# Patient Record
Sex: Male | Born: 1955 | Race: White | Hispanic: No
Health system: Southern US, Community
[De-identification: ages and names within clinical notes are randomized; demographics above are authoritative.]

---

## 2013-11-26 ENCOUNTER — Ambulatory Visit: Payer: Self-pay | Admitting: Ophthalmology

## 2014-04-16 ENCOUNTER — Ambulatory Visit: Payer: Self-pay | Admitting: Ophthalmology

## 2014-04-29 ENCOUNTER — Ambulatory Visit: Payer: Self-pay | Admitting: Ophthalmology

## 2014-06-20 ENCOUNTER — Emergency Department: Payer: Self-pay | Admitting: Emergency Medicine

## 2014-06-20 LAB — CBC WITH DIFFERENTIAL/PLATELET
BASOS ABS: 0 10*3/uL (ref 0.0–0.1)
Basophil #: 0 10*3/uL (ref 0.0–0.1)
Basophil %: 0.6 %
Basophil %: 0.4 %
EOS ABS: 0.1 10*3/uL (ref 0.0–0.7)
EOS ABS: 0 10*3/uL (ref 0.0–0.7)
EOS PCT: 1.2 %
Eosinophil %: 0.5 %
HCT: 42.4 % (ref 40.0–52.0)
HCT: 44.1 % (ref 40.0–52.0)
HGB: 13.7 g/dL (ref 13.0–18.0)
HGB: 13.8 g/dL (ref 13.0–18.0)
LYMPHS ABS: 1.2 10*3/uL (ref 1.0–3.6)
LYMPHS PCT: 17.8 %
LYMPHS PCT: 6.3 %
Lymphocyte #: 0.6 10*3/uL — ABNORMAL LOW (ref 1.0–3.6)
MCH: 26 pg (ref 26.0–34.0)
MCH: 25.4 pg — AB (ref 26.0–34.0)
MCHC: 32.2 g/dL (ref 32.0–36.0)
MCHC: 31.2 g/dL — ABNORMAL LOW (ref 32.0–36.0)
MCV: 81 fL (ref 80–100)
MCV: 81 fL (ref 80–100)
MONO ABS: 0.2 x10 3/mm (ref 0.2–1.0)
MONOS PCT: 1.6 %
Monocyte #: 0.5 x10 3/mm (ref 0.2–1.0)
Monocyte %: 7 %
NEUTROS PCT: 91.2 %
Neutrophil #: 4.8 10*3/uL (ref 1.4–6.5)
Neutrophil #: 8.9 10*3/uL — ABNORMAL HIGH (ref 1.4–6.5)
Neutrophil %: 73.4 %
PLATELETS: 233 10*3/uL (ref 150–440)
PLATELETS: 244 10*3/uL (ref 150–440)
RBC: 5.26 10*6/uL (ref 4.40–5.90)
RBC: 5.41 10*6/uL (ref 4.40–5.90)
RDW: 14.9 % — ABNORMAL HIGH (ref 11.5–14.5)
RDW: 14.8 % — AB (ref 11.5–14.5)
WBC: 6.5 10*3/uL (ref 3.8–10.6)
WBC: 9.8 10*3/uL (ref 3.8–10.6)

## 2014-06-20 LAB — URINALYSIS, COMPLETE
BILIRUBIN, UR: NEGATIVE
Bacteria: NONE SEEN
Blood: NEGATIVE
Glucose,UR: NEGATIVE mg/dL (ref 0–75)
KETONE: NEGATIVE
Leukocyte Esterase: NEGATIVE
Nitrite: NEGATIVE
Ph: 9 (ref 4.5–8.0)
Protein: NEGATIVE
RBC,UR: 5 /HPF (ref 0–5)
SQUAMOUS EPITHELIAL: NONE SEEN
Specific Gravity: 1.014 (ref 1.003–1.030)
WBC UR: <1 /HPF (ref 0–5)

## 2014-06-20 LAB — COMPREHENSIVE METABOLIC PANEL
ALBUMIN: 3.8 g/dL (ref 3.4–5.0)
ALK PHOS: 77 U/L
AST: 25 U/L (ref 15–37)
Anion Gap: 5 — ABNORMAL LOW (ref 7–16)
BUN: 12 mg/dL (ref 7–18)
Bilirubin,Total: 0.5 mg/dL (ref 0.2–1.0)
CHLORIDE: 103 mmol/L (ref 98–107)
CREATININE: 1.01 mg/dL (ref 0.60–1.30)
Calcium, Total: 8.8 mg/dL (ref 8.5–10.1)
Co2: 30 mmol/L (ref 21–32)
EGFR (African American): >60
Glucose: 86 mg/dL (ref 65–99)
Osmolality: 275 (ref 275–301)
POTASSIUM: 4 mmol/L (ref 3.5–5.1)
SGPT (ALT): 19 U/L (ref 12–78)
SODIUM: 138 mmol/L (ref 136–145)
Total Protein: 8.3 g/dL — ABNORMAL HIGH (ref 6.4–8.2)

## 2014-06-20 LAB — TROPONIN I

## 2014-06-20 LAB — BASIC METABOLIC PANEL
ANION GAP: 5 — AB (ref 7–16)
BUN: 12 mg/dL (ref 7–18)
CO2: 26 mmol/L (ref 21–32)
CREATININE: 1.08 mg/dL (ref 0.60–1.30)
Calcium, Total: 8.9 mg/dL (ref 8.5–10.1)
Chloride: 105 mmol/L (ref 98–107)
EGFR (Non-African Amer.): >60
Glucose: 142 mg/dL — ABNORMAL HIGH (ref 65–99)
Osmolality: 274 (ref 275–301)
Potassium: 3.7 mmol/L (ref 3.5–5.1)
Sodium: 136 mmol/L (ref 136–145)

## 2014-06-20 LAB — TSH: Thyroid Stimulating Horm: 0.95 u[IU]/mL

## 2014-06-20 LAB — PRO B NATRIURETIC PEPTIDE: B-TYPE NATIURETIC PEPTID: 209 pg/mL — AB (ref 0–125)

## 2014-06-20 LAB — MAGNESIUM: Magnesium: 2 mg/dL

## 2014-06-20 LAB — D-DIMER(ARMC): D-DIMER: 497 ng/mL

## 2014-06-21 LAB — TROPONIN I: Troponin-I: <0.02 ng/mL

## 2014-06-22 ENCOUNTER — Inpatient Hospital Stay: Payer: Self-pay | Admitting: Family Medicine

## 2014-06-22 LAB — CBC WITH DIFFERENTIAL/PLATELET
Basophil #: 0.1 10*3/uL (ref 0.0–0.1)
Basophil %: 1.2 %
EOS PCT: 1.4 %
Eosinophil #: 0.1 10*3/uL (ref 0.0–0.7)
HCT: 41.3 % (ref 40.0–52.0)
HGB: 13.2 g/dL (ref 13.0–18.0)
LYMPHS ABS: 2.2 10*3/uL (ref 1.0–3.6)
Lymphocyte %: 28 %
MCH: 25.8 pg — AB (ref 26.0–34.0)
MCHC: 31.9 g/dL — ABNORMAL LOW (ref 32.0–36.0)
MCV: 81 fL (ref 80–100)
MONOS PCT: 8.4 %
Monocyte #: 0.7 x10 3/mm (ref 0.2–1.0)
Neutrophil #: 4.8 10*3/uL (ref 1.4–6.5)
Neutrophil %: 61 %
Platelet: 210 10*3/uL (ref 150–440)
RBC: 5.11 10*6/uL (ref 4.40–5.90)
RDW: 15.1 % — AB (ref 11.5–14.5)
WBC: 7.8 10*3/uL (ref 3.8–10.6)

## 2014-06-22 LAB — BASIC METABOLIC PANEL
Anion Gap: 5 — ABNORMAL LOW (ref 7–16)
BUN: 11 mg/dL (ref 7–18)
CHLORIDE: 106 mmol/L (ref 98–107)
CREATININE: 0.99 mg/dL (ref 0.60–1.30)
Calcium, Total: 8.7 mg/dL (ref 8.5–10.1)
Co2: 30 mmol/L (ref 21–32)
EGFR (Non-African Amer.): >60
GLUCOSE: 86 mg/dL (ref 65–99)
Osmolality: 280 (ref 275–301)
Potassium: 4.2 mmol/L (ref 3.5–5.1)
Sodium: 141 mmol/L (ref 136–145)

## 2014-06-22 LAB — MAGNESIUM: MAGNESIUM: 2 mg/dL

## 2014-06-24 LAB — BASIC METABOLIC PANEL
ANION GAP: 6 — AB (ref 7–16)
BUN: 13 mg/dL (ref 7–18)
CHLORIDE: 103 mmol/L (ref 98–107)
Calcium, Total: 9.1 mg/dL (ref 8.5–10.1)
Co2: 29 mmol/L (ref 21–32)
Creatinine: 1.05 mg/dL (ref 0.60–1.30)
EGFR (Non-African Amer.): >60
GLUCOSE: 83 mg/dL (ref 65–99)
Osmolality: 275 (ref 275–301)
Potassium: 3.4 mmol/L — ABNORMAL LOW (ref 3.5–5.1)
Sodium: 138 mmol/L (ref 136–145)

## 2015-03-27 NOTE — Op Note (Signed)
PATIENT NAME:  Samuel Johnston, Mayo MR#:  [id number] DATE OF BIRTH:  1956/01/18  DATE OF PROCEDURE:  11/26/2013  PROCEDURE PERFORMED: 1.  Pars plana vitrectomy of the right eye.  2.  Gas exchange of the right eye.  3.  Endolaser of the right eye.   PREOPERATIVE DIAGNOSIS: Rhegmatogenous retinal detachment, macula-off.   POSTOPERATIVE DIAGNOSIS: Rhegmatogenous retinal detachment, macula-off.    ESTIMATED BLOOD LOSS: Less than 1 mL.   PRIMARY SURGEON: Aron BabaMatthew Tahjir Silveria, M.D.   ANESTHESIA: Retrobulbar block of the right eye with monitored anesthesia care.   COMPLICATIONS: None.   INDICATIONS FOR PROCEDURE: The patient presented to my office with loss of vision in his right eye. Examination revealed a barely macula-off rhegmatogenous retinal detachment of the right eye in his only eye. The risks, benefits and alternatives of the above procedure were discussed, and the patient wished to proceed.   DETAILS OF PROCEDURE: After informed consent was obtained, the patient was brought into the operative suite at [facility]. The patient was placed in the supine position and was given a small dose of propofol and a retrobulbar block was performed on the right eye by the primary surgeon without any complications. The right eye was prepped and draped in a sterile manner. After lid speculum was inserted, a 25-gauge trocar was placed inferotemporally through displaced conjunctiva in an oblique fashion, 4 mm beyond the limbus in the inferotemporal quadrant.  The infusion cannula was turned on and inserted through the trocar and secured in position with Steri-Strips. Two more trocars were placed in a similar fashion superotemporally and superonasally. The vitreous cutter and light pipe were introduced into the eye and a core vitrectomy was performed. The vitreous face was noted already to be elevated. Peripheral vitreous was trimmed for 360 degrees on shave mode with extreme care taken over the  area of retinal detachment. An extremely small retinal tear was identified at approximately 8:30.  An atrophic hole was also noted at approximately 9:30. A small draining posterior retinotomy was created and 9 o'clock. An air-fluid exchange was performed through this hole and the retina completely flattened. Endolaser was introduced and 6 rows of laser were placed in the area of the retinal detachment going from the ora serrata posteriorly. Each of the areas of retinal tears were clearly demarcated and lasered with at least 5 rows. Six to 7 rows of laser was then carried for the temporal 180 degrees. Nasal 180 degrees was given approximately 2 to 3 rows of laser posterior to the vitreous base insertion. Remnant fluid was removed through the draining retinotomy and then 4 rows of laser were placed around the draining retinotomy. 24% SF6 was used as an air gas exchange and the trocars were removed. The wounds were closed using 6-0 plain gut, and the eye was pressurized with SF6 to a pressure of 15 mmHg. 5 mg of dexamethasone was given into the inferior fornix, and the lid speculum was removed. The eye was cleaned, and TobraDex was placed in the eye. A patch and shield were placed over the eye, and the patient was taken to postanesthesia care with instructions to remain on his right side for 1 hour, followed by his left side for 1 week.      ____________________________ Samuel FellingMatthew F. Joyleen Haselton, MD mfa:dmm D: 11/26/2013 19:23:00 ET T: 11/26/2013 19:45:11 ET JOB#: [id number]  cc: Samuel FellingMatthew F. Champ MungoAppenzeller, MD, <Dictator> Cline CoolsMATTHEW F Jazyiah Yiu MD ELECTRONICALLY SIGNED 12/04/2013 7:17

## 2015-03-28 NOTE — H&P (Signed)
PATIENT NAME:  Samuel Johnston, Samuel Johnston MR#:  [id number] DATE OF BIRTH:  [birth year]  DATE OF ADMISSION:  06/20/2014  REFERRING PHYSICIAN: Dr. Mayford Knife  PRIMARY CARE PHYSICIAN: Dr. Burnadette Pop of [facility]   CHIEF COMPLAINT: Passing out.   HISTORY OF PRESENT ILLNESS: A 59 year old gentleman with a history of well-controlled asthma who is presenting after a syncopal episode. He saw his PCP today for 1-2 day duration of sore throat as well as a feeling of shortness of breath. Denies any cough, fevers, chills, or other symptomatology other than occasional lightheadedness. At his PCP's office noted to be hypertensive, systolic blood pressures in the 170s, also noted to have an irregular heart rate, sent to the emergency department for further workup and evaluation. Initial evaluation in the ER was essentially unrevealing other than hypertension. He was discharged from the ER. However, about 30-45 minutes after discharge he had a syncopal episode in the car after dinner. He was sitting at that time. Denies any palpitations, chest pain, shortness of breath, any preceding symptoms whatsoever. No tongue biting, loss of urine, or loss of bladder or bowel function. No postictal confusion. No seizure activity. This was a witnessed episode. No head trauma, loss of consciousness, brief few seconds only. He came to the hospital for further workup and evaluation. Currently has no complaints.   REVIEW OF SYSTEMS:  CONSTITUTIONAL: Denies fever, chills, fatigue, weakness. EYES: Denies blurry, double vision, or eye pain.  HEENT: Denies tinnitus, ear pain, hearing loss.  RESPIRATORY: Denies cough, wheeze, shortness of breath. CARDIOVASCULAR: Denies chest pain, palpitations, edema.   GASTROINTESTINAL: Denies nausea, vomiting, diarrhea, abdominal pain.  GENITOURINARY: Denies dysuria or hematuria.  ENDOCRINE: Denies nocturia or thyroid problems.  HEMATOLOGIC AND LYMPHATIC: Denies easy bruising, bleeding. SKIN: Denies rash  or lesions. MUSCULOSKELETAL: Denies pain in neck, back, shoulders, knees, hips, or arthritic symptoms.  NEUROLOGIC: Denies paralysis or paresthesias.  PSYCHIATRIC: Denies anxiety or depressive symptoms.   Otherwise, full review of systems performed by me is negative.   PAST MEDICAL HISTORY: Asthma, well-controlled.   SOCIAL HISTORY: Denies any alcohol, tobacco, or drug usage.   FAMILY HISTORY: Denies any known cardiovascular or pulmonary disorders.   ALLERGIES: NO KNOWN.   HOME MEDICATIONS: Include aspirin 81 mg p.o. daily, fish oil 1000 mg p.o. daily.   PHYSICAL EXAMINATION:  VITAL SIGNS: Temperature 98.3, heart rate 93, respirations 20, blood pressure 189/101, currently 172/94, saturating 97% on room air. Weight 113.4 kg, BMI 33.  GENERAL: Well-nourished, well-developed, Caucasian gentleman, currently in no acute distress.  HEAD: Normocephalic, atraumatic.  EYES: Pupils equal, round, reactive to light. Extraocular muscles intact. No scleral icterus.  MOUTH: Moist mucous membranes. Dentition intact. No abscess noted.  EARS, NOSE, AND THROAT: Clear. No exudates. No external lesions.  NECK: Supple. No thyromegaly. No nodules. No JVD.  PULMONARY: Clear to auscultation bilaterally without wheezes, rales, or rhonchi. No use of accessory muscles. Good respiratory effort.  CHEST:  Nontender on palpation.  CARDIOVASCULAR: S1, S2, irregular rate, irregular rhythm. No murmurs, rubs, or gallops. No edema. Pedal pulses 2+ bilaterally.  GASTROINTESTINAL: Soft, nontender, nondistended. No masses. Positive bowel sounds. No hepatosplenomegaly.  MUSCULOSKELETAL: No swelling, clubbing, or edema. Range of motion is full in all extremities. NEUROLOGIC: Cranial nerves II through XII intact. No gross neurologic deficits. Sensation intact. Reflexes intact.  SKIN: No ulcerations, lesions, no rashes, or cyanosis. Skin warm and dry. Turgor intact. PSYCHIATRIC: Mood and affect within normal limits. The patient  alert and oriented x 3. Insight and judgment intact.  LABORATORY DATA: EKG performed revealing normal sinus rhythm with occasional PACs. No conduction abnormalities noted. He had a chest x-ray performed revealing no acute cardiopulmonary process. Had a CT angiogram of the chest performed, which is negative for PE; however, does reveal an ascending thoracic aorta aneurysm measuring 4.3 cm.   Remainder of laboratory data: Sodium 136, potassium 3.7, chloride 105, bicarbonate 26, BUN 12, creatinine 1.08, glucose 142. LFTs: Protein of 8.3, otherwise within normal limits. Troponin I less than 0.02. TSH 0.9. WBC 9.8, hemoglobin 13.8, platelets of 244,000. Urinalysis negative for evidence of infection.   ASSESSMENT AND PLAN: A 59 year old gentleman with a history of well-controlled asthma, presenting after a syncopal episode.  1. Syncope. Admit to telemetry on observational status and trend cardiac enzymes x 3. He was not orthostatic based on his orthostatic vital signs. However, he did receive IV fluids prior to them. Continue with intravenous fluid hydration.  2. Hypertension urgency: Improved in the Emergency Department after a dose of labetalol. Will add p.r.n. hydralazine p.r.n. If his blood pressure does remain elevated, would likely need chronic medications either hydrochlorothiazide versus Norvasc. 3. Sore throat, supportive care. Cepacol lozenges. 4. 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 Will need outpatient followup of this.  5. Venous thromboembolism prophylaxis with heparin subcutaneous.  CODE STATUS: The patient is a full code.   TIME SPENT: 45 minutes.    ____________________________ Cletis Athensavid K. Jamiyla Ishee, MD dkh:lt D: 06/20/2014 20:41:08 ET T: 06/20/2014 22:09:20 ET JOB#: [id number]  cc: Cletis Athensavid K. Tatum Corl, MD, <Dictator> Natasha Burda Synetta ShadowK Krysten Veronica MD ELECTRONICALLY SIGNED 06/21/2014 20:29

## 2015-03-28 NOTE — Discharge Summary (Signed)
PATIENT NAME:  Samuel Johnston, Samuel Johnston MR#:  [id number] DATE OF BIRTH:  1956-04-16  DATE OF ADMISSION:  06/20/2014. DATE OF DISCHARGE:  06/20/2014.  DISCHARGE DIAGNOSES:   1.  Syncope secondary presumably to nonsustained ventricular tachycardia from oral decongestant medication.  2.  Hypertension.  3.  Bronchitis.   DISCHARGE MEDICATIONS: Aspirin 81 mg daily, fish oil 1000 mg daily, Levaquin 500 mg daily, amlodipine 5 mg daily.   REASON FOR ADMISSION: A 59 year old male who presented with cough, congestion and syncope. Please see H and P for history of present illness, past medical history, and physical exam.   HOSPITAL COURSE: The patient was admitted and he reports taking a lot of decongestant medicine for recent URI.  He felt some arrhythmic feeling, presyncope, but no chest pain. Cardiac enzymes were normal. With discontinuation of the decongestant medicine he had no more PVCs, in fact his telemetry was totally normal. Chest CT showed no PE and a mild descending thoracic aorta of 4.3 cm. His bronchitis was treated with p.o. Levaquin and Cepacol lozenges. He was hydrated, monitored closely and overall asymptomatic. He is instructed not to take any more oral decongestants. He is set up for stress test, apparently per Dr. Burnadette PopLinthavong. His blood pressure did not respond to lisinopril and so we switched to amlodipine and he will follow up with Dr. Burnadette PopLinthavong.  Overall prognosis is good.    ____________________________ Danella PentonMark F. Zayana Salvador, MD mfm:lt D: 06/22/2014 08:12:21 ET T: 06/22/2014 10:13:28 ET JOB#: [id number]  cc: Danella PentonMark F. Morenike Cuff, MD, <Dictator> Danella PentonMARK F Emoni Yang MD ELECTRONICALLY SIGNED 06/23/2014 8:15

## 2015-03-28 NOTE — Op Note (Signed)
PATIENT NAME:  Samuel Johnston, Samuel Johnston MR#:  [id number] DATE OF BIRTH:  1956-07-03  LOCATION:  [facility].  DATE OF PROCEDURE:  04/29/2014  PREOPERATIVE DIAGNOSIS: Visually significant cataract of the right eye.   POSTOPERATIVE DIAGNOSIS: Visually significant cataract of the right eye.   OPERATIVE PROCEDURE: Cataract extraction by phacoemulsification with implant of intraocular lens to right eye.   SURGEON: Galen ManilaWilliam Dawon Troop, MD.   ANESTHESIA:  1. Managed anesthesia care.  2. Topical tetracaine drops followed by 2% Xylocaine jelly applied in the preoperative holding area.   COMPLICATIONS: None.   SURGICAL TECHNIQUE:  Stop and chop.  LENS IMPLANT:  Tecnis ZCB00 15.5 diopter lens, serial #[id number].   DESCRIPTION OF PROCEDURE: The patient was examined and consented in the preoperative holding area where the aforementioned topical anesthesia was applied to the right eye and then brought back to the Operating Room where the right eye was prepped and draped in the usual sterile ophthalmic fashion and a lid speculum was placed.   A paracentesis was created with the side port blade and the anterior chamber was filled with viscoelastic. A near clear corneal incision was performed with the steel keratome. A continuous curvilinear capsulorrhexis was performed with a cystotome followed by the capsulorrhexis forceps. Hydrodissection and hydrodelineation were carried out with BSS on a blunt cannula. The lens was removed in a stop and chop technique and the remaining cortical material was removed with the irrigation-aspiration handpiece. The capsular bag was inflated with viscoelastic and the Tecnis ZCB00 15.5-diopter lens, serial number [id number] was placed in the capsular bag without complication. The remaining viscoelastic was removed from the eye with the irrigation-aspiration handpiece. The wounds were hydrated. The anterior chamber was flushed with Miostat and the eye was inflated to  physiologic pressure. 0.1 mL of cefuroxime concentration 10 mg/mL was placed in the anterior chamber. The wounds were found to be water tight.   The eye was dressed with Vigamox. The patient was given protective glasses to wear throughout the day and a shield with which to sleep tonight. The patient was also given drops with which to begin a drop regimen today and will follow-up with me in one day.    ____________________________ Jerilee FieldWilliam L. Athea Haley, MD wlp:ce D: 04/29/2014 17:15:47 ET T: 04/29/2014 19:39:22 ET JOB#: [id number]  cc: Audrena Talaga L. Lyndsay Talamante, MD, <Dictator> Jerilee FieldWILLIAM L Jaylynne Birkhead MD ELECTRONICALLY SIGNED 05/07/2014 13:42

## 2015-03-28 NOTE — Discharge Summary (Signed)
PATIENT NAME:  Samuel HareJACKSON, Efton MR#:  [id number] DATE OF BIRTH:  [birth year]  DATE OF ADMISSION:  06/22/2014 DATE OF DISCHARGE:  06/24/2014  DISCHARGE DIAGNOSES: 1.  Syncope.  2.  Bronchitis, resolved.  3.  Accelerated hypertension, stabilized. 4.  Anxiety.  5.  Abdominal aortic aneurysm.  DISCHARGE MEDICATIONS: 1.  Aspirin 81 mg p.o. daily.  2.  Fish oil 1000 mg p.o. daily. 3.  Amlodipine 5 mg p.o. daily.  4.  Hydrochlorothiazide 12.5 mg p.o. daily.  5.  Paroxetine 20 mg p.o. daily.   MEDICATIONS TO DISCONTINUE: Will stop the levofloxacin at this time.   CONSULTANTS: None.   PROCEDURES: None.   PERTINENT DIAGNOSTIC DATA PRIOR TO DISCHARGE: CT of the chest did show a 4.3 cm triple-A in the thoracic region.   Troponins were negative. Sodium 141, potassium 4.2, creatinine 0.99. White blood cell count 7.8, hemoglobin 13.2, and platelets 210,000.   BRIEF HOSPITAL COURSE:  1.  Syncope. The patient had an initial episode of syncope prior to admission, but did not have any further issues upon discharge.  2.  Bronchitis. The patient initially had symptoms of bronchitis, treated with a short course of antibiotics. O2 sat remained 100% and was afebrile. Chest x-ray and CT were negative; therefore, antibiotics were discontinued prior to discharge.  3.  Accelerated hypertension. The patient was initially planned to be discharged on prior days, but was kept because of accelerated blood pressure. That was controlled with amlodipine and addition of hydrochlorothiazide. He was in the 120s upon discharge. Some of this is likely due to his underlying anxiety. 4.  Anxiety. Plan to start him on Paxil. Should help him with overall issues. We may consider doing a stress test as an outpatient given his other symptoms, vague chest discomfort symptoms, and his potential risk factors.  5.  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 Needs annual followup with ultrasound.   DISPOSITION: He is in  stable condition and will be discharged to home. He will follow up with Dr. Burnadette PopLinthavong in 1 week. Probably need a stress test as an outpatient.  ____________________________ Marisue IvanKanhka Gabrial Poppell, MD kl:sb D: 06/24/2014 08:28:57 ET T: 06/24/2014 10:51:37 ET JOB#: [id number]  cc: Marisue IvanKanhka Braylyn Eye, MD, <Dictator> Marisue IvanKANHKA Helyne Genther MD ELECTRONICALLY SIGNED 06/29/2014 8:06

## 2015-11-12 ENCOUNTER — Ambulatory Visit: Payer: BLUE CROSS/BLUE SHIELD | Attending: Internal Medicine

## 2015-11-12 DIAGNOSIS — G4733 Obstructive sleep apnea (adult) (pediatric): Secondary | ICD-10-CM | POA: Insufficient documentation

## 2015-12-04 ENCOUNTER — Ambulatory Visit: Payer: BLUE CROSS/BLUE SHIELD | Attending: Internal Medicine

## 2015-12-04 DIAGNOSIS — G4733 Obstructive sleep apnea (adult) (pediatric): Secondary | ICD-10-CM | POA: Insufficient documentation

## 2016-02-17 ENCOUNTER — Other Ambulatory Visit: Payer: Self-pay | Admitting: Family Medicine

## 2016-02-17 DIAGNOSIS — I714 Abdominal aortic aneurysm, without rupture, unspecified: Secondary | ICD-10-CM

## 2016-02-23 ENCOUNTER — Ambulatory Visit: Payer: BLUE CROSS/BLUE SHIELD

## 2016-03-08 ENCOUNTER — Ambulatory Visit
Admission: RE | Admit: 2016-03-08 | Discharge: 2016-03-08 | Disposition: A | Discharge status: Home / Self Care | Payer: 59 | Source: Ambulatory Visit | Attending: Family Medicine | Admitting: Family Medicine

## 2016-03-08 DIAGNOSIS — N281 Cyst of kidney, acquired: Secondary | ICD-10-CM | POA: Insufficient documentation

## 2016-03-08 DIAGNOSIS — I723 Aneurysm of iliac artery: Secondary | ICD-10-CM | POA: Insufficient documentation

## 2016-03-08 DIAGNOSIS — I714 Abdominal aortic aneurysm, without rupture, unspecified: Secondary | ICD-10-CM

## 2016-05-20 IMAGING — US US RETROPERITONEAL COMPLETE
1 series · 13 of 25 positions shown · non-contrast
Comparison: None.

CLINICAL DATA: Abdominal aortic aneurysm.

EXAM:
ULTRASOUND RETROPERITONEAL COMPLETE
TECHNIQUE: Ultrasound examination of the abdominal aorta was performed to
evaluate for abdominal aortic aneurysm. The common iliac arteries,
IVC, and kidneys were also evaluated.

[Series 1: us retroperitoneal complete · 0.34mm/px · 13 of 57 slices shown]
[im 1/57]
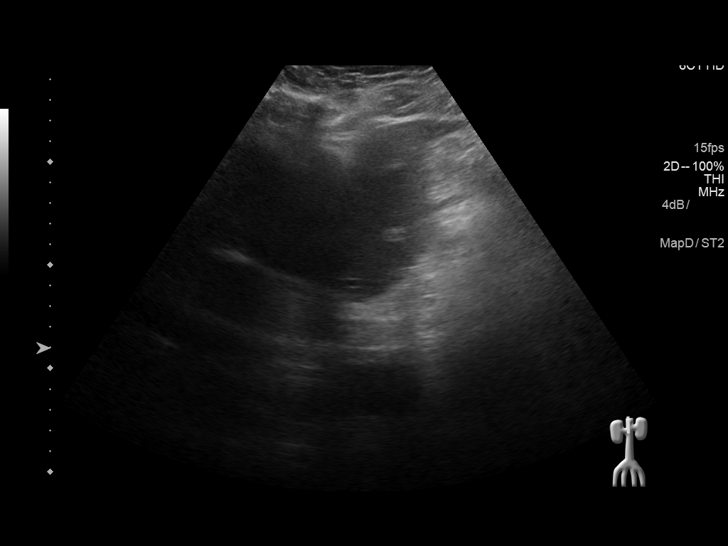
[im 5/57]
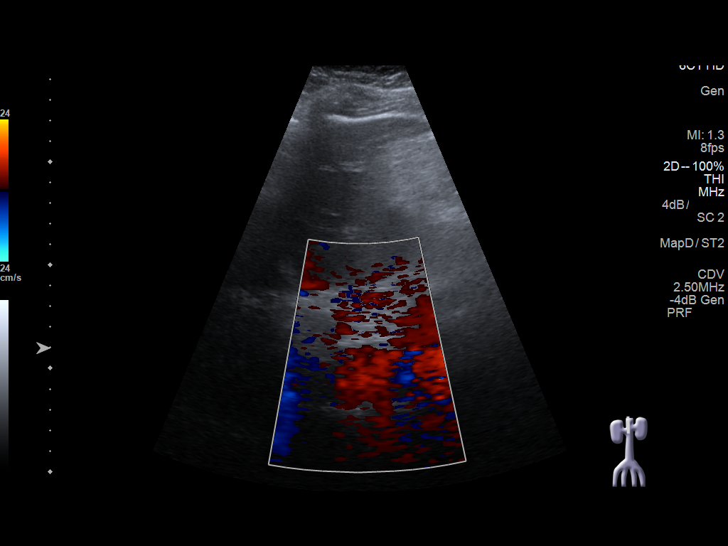
[im 10/57]
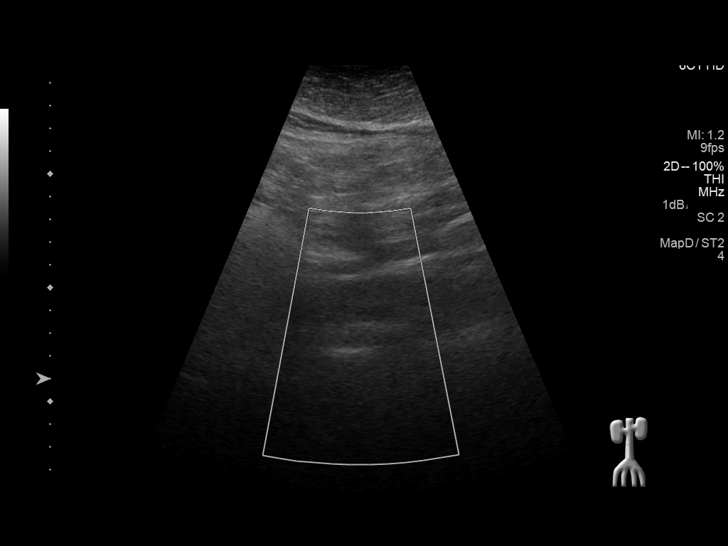
[im 15/57]
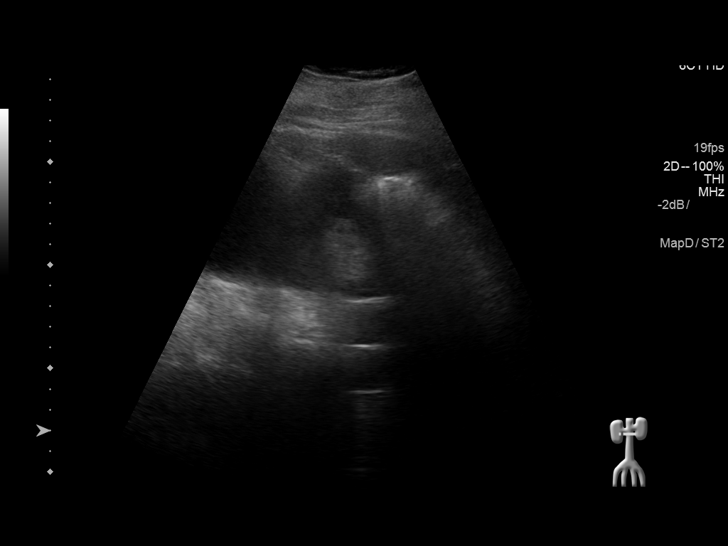
[im 19/57]
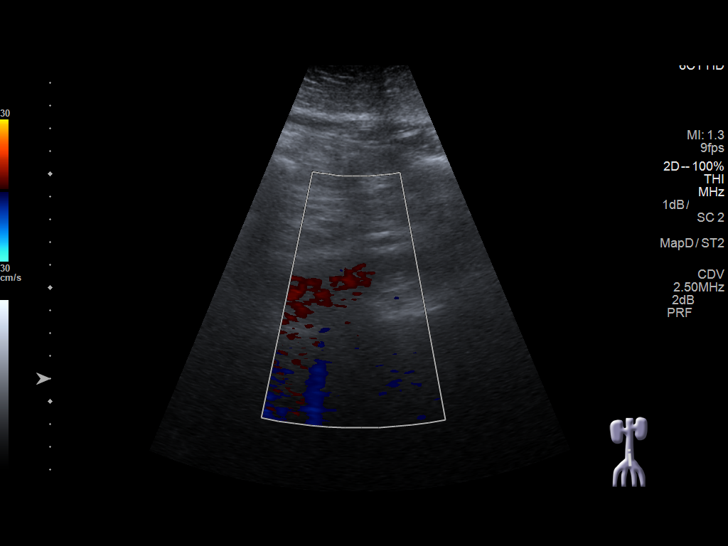
[im 24/57]
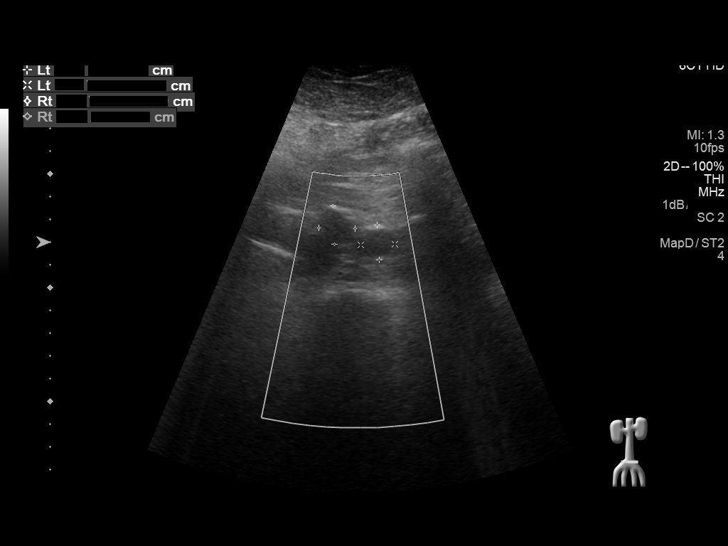
[im 29/57]
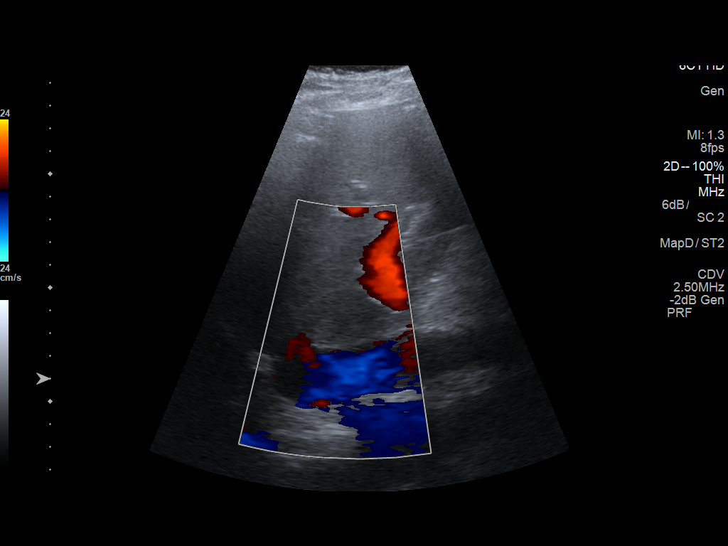
[im 33/57]
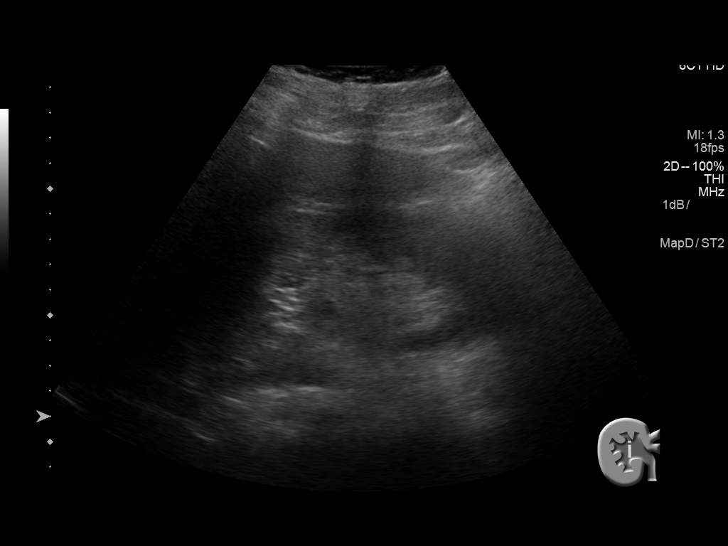
[im 38/57]
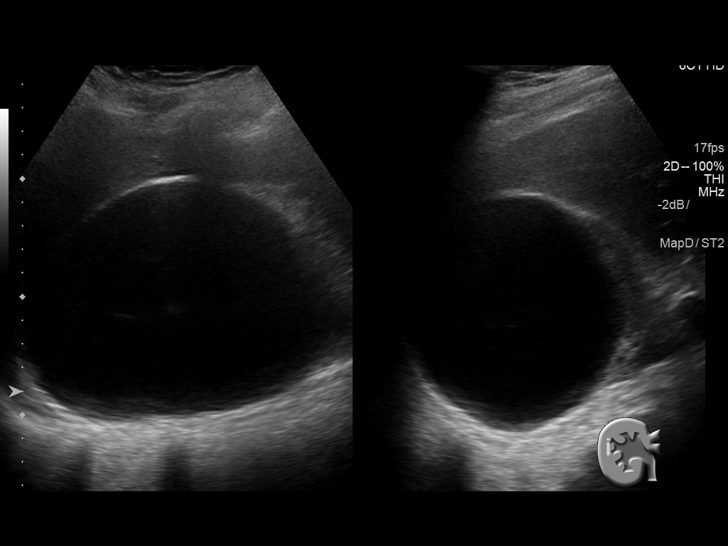
[im 43/57]
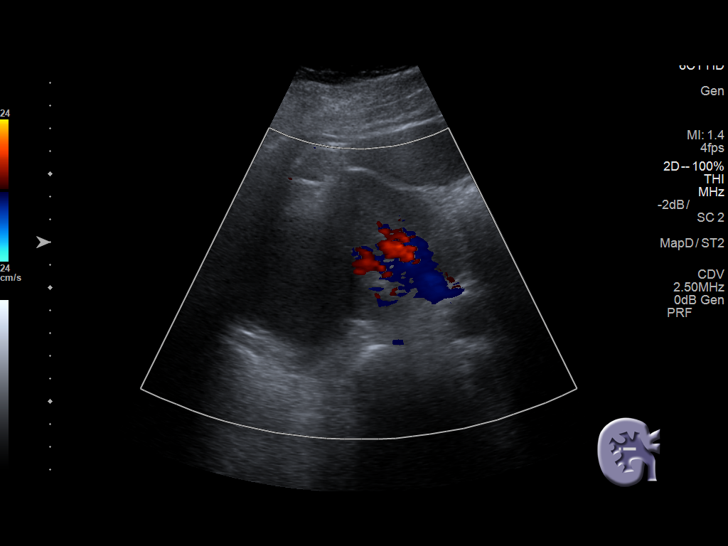
[im 47/57]
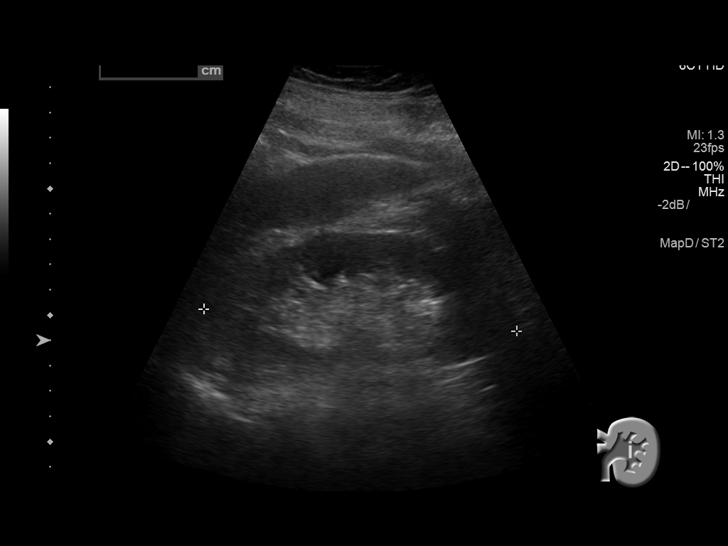
[im 52/57]
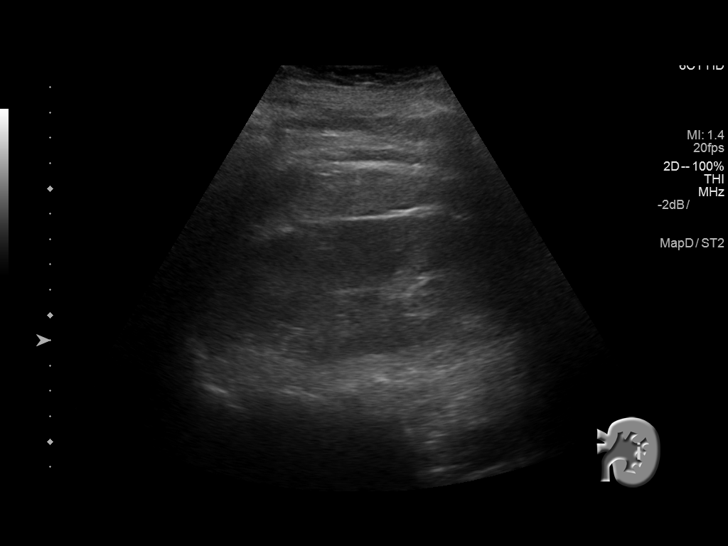
[im 57/57]
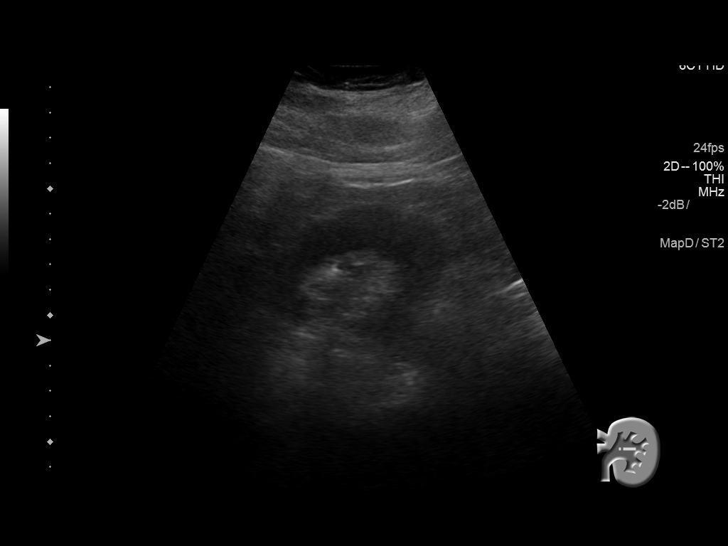

[13 of 25 positions shown; findings below may reference images not displayed]

FINDINGS: Abdominal Aorta

3.2 cm abdominal aortic aneurysm.

Maximum AP

Diameter:  3.1 cm

Maximum TRV

Diameter: 3.2 cm

Right Common Iliac Artery

Aneurysmal dilatation to 1.6 cm.

Left Common Iliac Artery

Aneurysmal dilatation to 1.5 cm.

IVC

No abnormality visualized.

Right Kidney

Length: 13.7 cm Echogenicity within normal limits. No hydronephrosis
visualized. 13.1 cm cm simple cyst.

Left Kidney

Length: 12.4 cm Echogenicity within normal limits. No mass or
hydronephrosis visualized.
IMPRESSION: 1. 3.2 cm abdominal aortic aneurysm. Recommend followup by
ultrasound in 3 years. This recommendation follows ACR consensus
guidelines: White Paper of the ACR Incidental Findings Committee II
on Vascular Findings. [HOSPITAL] 0586; [DATE]

2. Bilateral common iliac artery aneurysms, 1.6 cm on the right,
cm on the left. Vascular surgery consultation suggested.

3.  Large 13.1 cm simple cyst right kidney.

## 2019-09-17 ENCOUNTER — Other Ambulatory Visit: Payer: Self-pay

## 2019-09-17 ENCOUNTER — Ambulatory Visit: Payer: Self-pay

## 2019-09-17 DIAGNOSIS — Z23 Encounter for immunization: Secondary | ICD-10-CM

## 2019-09-17 NOTE — Patient Instructions (Signed)
Influenza (Flu) Vaccine (Inactivated or Recombinant): What You Need to Know 1. Why get vaccinated? Influenza vaccine can prevent influenza (flu). Flu is a contagious disease that spreads around the [place] every year, usually between October and May. Anyone can get the flu, but it is more dangerous for some people. Infants and young children, people 62 years of age and older, pregnant women, and people with certain health conditions or a weakened immune system are at greatest risk of flu complications. Pneumonia, bronchitis, sinus infections and ear infections are examples of flu-related complications. If you have a medical condition, such as heart disease, cancer or diabetes, flu can make it worse. Flu can cause fever and chills, sore throat, muscle aches, fatigue, cough, headache, and runny or stuffy nose. Some people may have vomiting and diarrhea, though this is more common in children than adults. Each year thousands of people in the [place] die from flu, and many more are hospitalized. Flu vaccine prevents millions of illnesses and flu-related visits to the doctor each year. 2. Influenza vaccine CDC recommends everyone 6 months of age and older get vaccinated every flu season. Children 6 months through 8 years of age may need 2 doses during a single flu season. Everyone else needs only 1 dose each flu season. It takes about 2 weeks for protection to develop after vaccination. There are many flu viruses, and they are always changing. Each year a new flu vaccine is made to protect against three or four viruses that are likely to cause disease in the upcoming flu season. Even when the vaccine doesn't exactly match these viruses, it may still provide some protection. Influenza vaccine does not cause flu. Influenza vaccine may be given at the same time as other vaccines. 3. Talk with your health care provider Tell your vaccine provider if the person getting the vaccine:  Has had an  allergic reaction after a previous dose of influenza vaccine, or has any severe, life-threatening allergies.  Has ever had Guillain-Barr Syndrome (also called GBS). In some cases, your health care provider may decide to postpone influenza vaccination to a future visit. People with minor illnesses, such as a cold, may be vaccinated. People who are moderately or severely ill should usually wait until they recover before getting influenza vaccine. Your health care provider can give you more information. 4. Risks of a vaccine reaction  Soreness, redness, and swelling where shot is given, fever, muscle aches, and headache can happen after influenza vaccine.  There may be a very small increased risk of Guillain-Barr Syndrome (GBS) after inactivated influenza vaccine (the flu shot). Young children who get the flu shot along with pneumococcal vaccine (PCV13), and/or DTaP vaccine at the same time might be slightly more likely to have a seizure caused by fever. Tell your health care provider if a child who is getting flu vaccine has ever had a seizure. People sometimes faint after medical procedures, including vaccination. Tell your provider if you feel dizzy or have vision changes or ringing in the ears. As with any medicine, there is a very remote chance of a vaccine causing a severe allergic reaction, other serious injury, or death. 5. What if there is a serious problem? An allergic reaction could occur after the vaccinated person leaves the clinic. If you see signs of a severe allergic reaction (hives, swelling of the face and throat, difficulty breathing, a fast heartbeat, dizziness, or weakness), call 9-1-1 and get the person to the nearest hospital. For other signs that   concern you, call your health care provider. Adverse reactions should be reported to the Vaccine Adverse Event Reporting System (VAERS). Your health care provider will usually file this report, or you can do it yourself. Visit the  VAERS website at www.vaers.hhs.gov or call 1-800-822-7967.VAERS is only for reporting reactions, and VAERS staff do not give medical advice. 6. The National Vaccine Injury Compensation Program The National Vaccine Injury Compensation Program (VICP) is a federal program that was created to compensate people who may have been injured by certain vaccines. Visit the VICP website at www.hrsa.gov/vaccinecompensation or call 1-800-338-2382 to learn about the program and about filing a claim. There is a time limit to file a claim for compensation. 7. How can I learn more?  Ask your healthcare provider.  Call your local or state health department.  Contact the Centers for Disease Control and Prevention (CDC): ? Call 1-800-232-4636 (1-800-CDC-INFO) or ? Visit CDC's www.cdc.gov/flu Vaccine Information Statement (Interim) Inactivated Influenza Vaccine (07/19/2018) This information is not intended to replace advice given to you by your health care provider. Make sure you discuss any questions you have with your health care provider. Document Released: 09/15/2006 Document Revised: 03/12/2019 Document Reviewed: 07/23/2018 Elsevier Patient Education  2020 [employer] Preventing Influenza, Adult Influenza, more commonly known as "the flu," is a viral infection that mainly affects the respiratory tract. The respiratory tract includes structures that help you breathe, such as the lungs, nose, and throat. The flu causes many common cold symptoms, as well as a high fever and body aches. The flu spreads easily from person to person (is contagious). The flu is most common from December through March. This is called flu season.You can catch the flu virus by:  Breathing in droplets from an infected person's cough or sneeze.  Touching something that was recently contaminated with the virus and then touching your mouth, nose, or eyes. What can I do to lower my risk?        You can decrease your risk of getting  the flu by:  Getting a flu shot (influenza vaccination) every year. This is the best way to prevent the flu. A flu shot is recommended for everyone age 6 months and older. ? It is best to get a flu shot in the fall, as soon as it is available. Getting a flu shot during winter or spring instead is still a good idea. Flu season can last into early spring. ? Preventing the flu through vaccination requires getting a new flu shot every year. This is because the flu virus changes slightly (mutates) from one year to the next. Even if a flu shot does not completely protect you from all flu virus mutations, it can reduce the severity of your illness and prevent dangerous complications of the flu. ? If you are pregnant, you can and should get a flu shot. ? If you have had a reaction to the shot in the past or if you are allergic to eggs, check with your health care provider before getting a flu shot. ? Sometimes the vaccine is available as a nasal spray. In some years, the nasal spray has not been as effective against the flu virus. Check with your health care provider if you have questions about this.  Practicing good health habits. This is especially important during flu season. ? Avoid contact with people who are sick with flu or cold symptoms. ? Wash your hands with soap and water often. If soap and water are not available, use alcohol-based   hand sanitizer. ? Avoid touching your hands to your face, especially when you have not washed your hands recently. ? Use a disinfectant to clean surfaces at home and at work that may be contaminated with the flu virus. ? Keep your body's disease-fighting system (immune system) in good shape by eating a healthy diet, drinking plenty of fluids, getting enough sleep, and exercising regularly. If you do get the flu, avoid spreading it to others by:  Staying home until your symptoms have been gone for at least one day.  Covering your mouth and nose when you cough or  sneeze.  Avoiding close contact with others, especially babies and elderly people. Why are these changes important? Getting a flu shot and practicing good health habits protects you as well as other people. If you get the flu, your friends, family, and co-workers are also at risk of getting it, because it spreads so easily to others. Each year, about 2 out of every 10 people get the flu. Having the flu can lead to complications, such as pneumonia, ear infection, and sinus infection. The flu also can be deadly, especially for babies, people older than age 62, and people who have serious long-term diseases. How is this treated? Most people recover from the flu by resting at home and drinking plenty of fluids. However, a prescription antiviral medicine may reduce your flu symptoms and may make your flu go away sooner. This medicine must be started within a few days of getting flu symptoms. You can talk with your health care provider about whether you need an antiviral medicine. Antiviral medicine may be prescribed for people who are at risk for more serious flu symptoms. This includes people who:  Are older than age 62.  Are pregnant.  Have a condition that makes the flu worse or more dangerous. Where to find more information  Centers for Disease Control and Prevention: www.cdc.gov/flu/index.htm  Flu.gov: www.flu.gov/prevention-vaccination  American [employer]: familydoctor.org/familydoctor/en/kids/vaccines/preventing-the-flu.html Contact a health care provider if:  You have influenza and you develop new symptoms.  You have: ? Chest pain. ? Diarrhea. ? A fever.  Your cough gets worse, or you produce more mucus. Summary  The best way to prevent the flu is to get a flu shot every year in the fall.  Even if you get the flu after you have received the yearly vaccine, your flu may be milder and go away sooner because of your flu shot.  If you get the flu, antiviral  medicines that are started with a few days of symptoms may reduce your flu symptoms and may make your flu go away sooner.  You can also help prevent the flu by practicing good health habits. This information is not intended to replace advice given to you by your health care provider. Make sure you discuss any questions you have with your health care provider. Document Released: 12/06/2015 Document Revised: 11/03/2017 Document Reviewed: 07/30/2016 Elsevier Patient Education  2020 [employer]  

## 2019-09-17 NOTE — Progress Notes (Signed)
     Patient ID: Samuel Johnston, male    DOB: June 08, 1956, 63 y.o.   MRN: [id number]    Thank you!!  [facility] [place]: [phone]  Cell:  [phone] Website: [email or web address]

## 2020-07-10 ENCOUNTER — Other Ambulatory Visit: Payer: Self-pay

## 2022-09-01 ENCOUNTER — Other Ambulatory Visit: Payer: Self-pay | Admitting: Physician Assistant

## 2022-09-01 DIAGNOSIS — I714 Abdominal aortic aneurysm, without rupture, unspecified: Secondary | ICD-10-CM

## 2022-12-07 ENCOUNTER — Other Ambulatory Visit: Payer: Self-pay

## 2022-12-07 DIAGNOSIS — I714 Abdominal aortic aneurysm, without rupture, unspecified: Secondary | ICD-10-CM

## 2023-03-07 ENCOUNTER — Other Ambulatory Visit: Payer: Self-pay | Admitting: Physician Assistant

## 2023-03-07 DIAGNOSIS — I714 Abdominal aortic aneurysm, without rupture, unspecified: Secondary | ICD-10-CM
